# Patient Record
Sex: Male | Born: 1990 | Race: Black or African American | Hispanic: No | Marital: Single | Smoking: Current every day smoker
Health system: Southern US, Community
[De-identification: ages and names within clinical notes are randomized; demographics above are authoritative.]

## PROBLEM LIST (undated history)

## (undated) DIAGNOSIS — Q282 Arteriovenous malformation of cerebral vessels: Secondary | ICD-10-CM

## (undated) HISTORY — DX: Arteriovenous malformation of cerebral vessels: Q28.2

---

## 2015-02-13 ENCOUNTER — Encounter (HOSPITAL_COMMUNITY): Payer: Self-pay

## 2015-02-13 ENCOUNTER — Emergency Department (HOSPITAL_COMMUNITY): Payer: Self-pay

## 2015-02-13 ENCOUNTER — Emergency Department (HOSPITAL_COMMUNITY)
Admission: EM | Admit: 2015-02-13 | Discharge: 2015-02-13 | Disposition: A | Discharge status: Home / Self Care | Payer: Self-pay | Attending: Emergency Medicine | Admitting: Emergency Medicine

## 2015-02-13 DIAGNOSIS — W3400XA Accidental discharge from unspecified firearms or gun, initial encounter: Secondary | ICD-10-CM

## 2015-02-13 DIAGNOSIS — Y9389 Activity, other specified: Secondary | ICD-10-CM | POA: Insufficient documentation

## 2015-02-13 DIAGNOSIS — Y9289 Other specified places as the place of occurrence of the external cause: Secondary | ICD-10-CM | POA: Insufficient documentation

## 2015-02-13 DIAGNOSIS — Y998 Other external cause status: Secondary | ICD-10-CM | POA: Insufficient documentation

## 2015-02-13 DIAGNOSIS — T1490XA Injury, unspecified, initial encounter: Secondary | ICD-10-CM

## 2015-02-13 DIAGNOSIS — S3991XA Unspecified injury of abdomen, initial encounter: Secondary | ICD-10-CM | POA: Insufficient documentation

## 2015-02-13 DIAGNOSIS — Z720 Tobacco use: Secondary | ICD-10-CM | POA: Insufficient documentation

## 2015-02-13 LAB — CBC
HEMATOCRIT: 44.8 % (ref 39.0–52.0)
Hemoglobin: 15.1 g/dL (ref 13.0–17.0)
MCH: 31.3 pg (ref 26.0–34.0)
MCHC: 33.7 g/dL (ref 30.0–36.0)
MCV: 92.9 fL (ref 78.0–100.0)
Platelets: 213 10*3/uL (ref 150–400)
RBC: 4.82 MIL/uL (ref 4.22–5.81)
RDW: 12.8 % (ref 11.5–15.5)
WBC: 5.4 10*3/uL (ref 4.0–10.5)

## 2015-02-13 LAB — COMPREHENSIVE METABOLIC PANEL
ALT: 20 U/L (ref 17–63)
AST: 24 U/L (ref 15–41)
Albumin: 4.2 g/dL (ref 3.5–5.0)
Alkaline Phosphatase: 34 U/L — ABNORMAL LOW (ref 38–126)
Anion gap: 13 (ref 5–15)
BUN: 9 mg/dL (ref 6–20)
CHLORIDE: 103 mmol/L (ref 101–111)
CO2: 20 mmol/L — AB (ref 22–32)
Calcium: 9.2 mg/dL (ref 8.9–10.3)
Creatinine, Ser: 1.01 mg/dL (ref 0.61–1.24)
Glucose, Bld: 105 mg/dL — ABNORMAL HIGH (ref 65–99)
POTASSIUM: 3.5 mmol/L (ref 3.5–5.1)
SODIUM: 136 mmol/L (ref 135–145)
Total Bilirubin: 0.5 mg/dL (ref 0.3–1.2)
Total Protein: 6.8 g/dL (ref 6.5–8.1)

## 2015-02-13 LAB — I-STAT CHEM 8, ED
BUN: 9 mg/dL (ref 6–20)
CHLORIDE: 105 mmol/L (ref 101–111)
Calcium, Ion: 1.1 mmol/L — ABNORMAL LOW (ref 1.12–1.23)
Creatinine, Ser: 1.1 mg/dL (ref 0.61–1.24)
Glucose, Bld: 101 mg/dL — ABNORMAL HIGH (ref 65–99)
HCT: 51 % (ref 39.0–52.0)
Hemoglobin: 17.3 g/dL — ABNORMAL HIGH (ref 13.0–17.0)
POTASSIUM: 3.4 mmol/L — AB (ref 3.5–5.1)
SODIUM: 142 mmol/L (ref 135–145)
TCO2: 18 mmol/L (ref 0–100)

## 2015-02-13 LAB — PREPARE FRESH FROZEN PLASMA
Unit division: 0
Unit division: 0

## 2015-02-13 LAB — PROTIME-INR
INR: 1.07 (ref 0.00–1.49)
Prothrombin Time: 14.1 seconds (ref 11.6–15.2)

## 2015-02-13 LAB — TYPE AND SCREEN
ABO/RH(D): A POS
Antibody Screen: NEGATIVE
UNIT DIVISION: 0
UNIT DIVISION: 0

## 2015-02-13 LAB — CDS SEROLOGY

## 2015-02-13 LAB — BLOOD PRODUCT ORDER (VERBAL) VERIFICATION

## 2015-02-13 LAB — ABO/RH: ABO/RH(D): A POS

## 2015-02-13 LAB — ETHANOL: Alcohol, Ethyl (B): 47 mg/dL — ABNORMAL HIGH (ref <5)

## 2015-02-13 MED ORDER — TETANUS-DIPHTH-ACELL PERTUSSIS 5-2.5-18.5 LF-MCG/0.5 IM SUSP
0.5000 mL | Freq: Once | INTRAMUSCULAR | Status: AC
  Administered 2015-02-13: 0.5 mL via INTRAMUSCULAR
  Filled 2015-02-13: qty 0.5

## 2015-02-13 MED ORDER — SODIUM CHLORIDE 0.9 % IV SOLN
INTRAVENOUS | Status: AC | PRN
  Administered 2015-02-13: 1000 mL via INTRAVENOUS

## 2015-02-13 MED ORDER — FENTANYL CITRATE (PF) 100 MCG/2ML IJ SOLN
50.0000 ug | Freq: Once | INTRAMUSCULAR | Status: AC
  Administered 2015-02-13: 50 ug via INTRAVENOUS
  Filled 2015-02-13: qty 2

## 2015-02-13 MED ORDER — IOHEXOL 350 MG/ML SOLN: 100.0000 mL | Freq: Once | INTRAVENOUS | Status: DC | PRN

## 2015-02-13 MED ORDER — OXYCODONE-ACETAMINOPHEN 5-325 MG PO TABS: 1.0000 | ORAL_TABLET | Freq: Four times a day (QID) | ORAL | Status: AC | PRN

## 2015-02-13 NOTE — ED Notes (Signed)
Clothing removed, warm blankets given. 
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
## 2015-02-13 NOTE — ED Notes (Signed)
Pt moved to B16
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
## 2015-02-15 MED ORDER — IOHEXOL 300 MG/ML  SOLN
100.0000 mL | Freq: Once | INTRAMUSCULAR | Status: AC | PRN
  Administered 2015-02-13: 100 mL via INTRAVENOUS
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
## 2018-04-16 DIAGNOSIS — M2420 Disorder of ligament, unspecified site: Secondary | ICD-10-CM

## 2018-04-16 HISTORY — DX: Disorder of ligament, unspecified site: M24.20

## 2020-01-07 ENCOUNTER — Encounter (INDEPENDENT_AMBULATORY_CARE_PROVIDER_SITE_OTHER): Payer: Self-pay | Admitting: Neurological Surgery

## 2020-01-15 ENCOUNTER — Encounter (INDEPENDENT_AMBULATORY_CARE_PROVIDER_SITE_OTHER): Payer: Self-pay

## 2020-01-15 ENCOUNTER — Ambulatory Visit (INDEPENDENT_AMBULATORY_CARE_PROVIDER_SITE_OTHER): Payer: BC Managed Care – PPO | Admitting: Neurological Surgery

## 2020-01-15 ENCOUNTER — Encounter (INDEPENDENT_AMBULATORY_CARE_PROVIDER_SITE_OTHER): Payer: Self-pay | Admitting: Neurological Surgery

## 2020-01-15 DIAGNOSIS — Z720 Tobacco use: Secondary | ICD-10-CM

## 2020-01-15 DIAGNOSIS — Q282 Arteriovenous malformation of cerebral vessels: Secondary | ICD-10-CM | POA: Insufficient documentation

## 2020-01-15 DIAGNOSIS — G4452 New daily persistent headache (NDPH): Secondary | ICD-10-CM

## 2020-01-15 DIAGNOSIS — Z01818 Encounter for other preprocedural examination: Secondary | ICD-10-CM

## 2020-01-15 NOTE — Progress Notes (Signed)
[facility] Neurosurgery  New Patient Note    Referring MD:     Self   Primary Care MD: Lennox Pippins, MD     MRN: [id number]    HPI   Chief Complaint:  Headache, AVM     HPI  Michael Thornton is a 29 y.o. male with history of AVM who presents for new left-sided headaches for past 3 months. He was diagnosed with AVM incidentally in 10/2018 during workup for right-sided neck pain. 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 He had a cerebral angiogram with Dr. 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 He was asymptomatic at that time, and there was no treatment recommended.    At today's visit he presents with daily persistent headaches on the left side for the past 3 months. The pain is in the left temporal area and radiates into the jaw, worse with chewing. He has been taking Gabapentin and Fioricet prescribed by his PCP with little relief. He denies any other neurologic symptoms. He works at the [employer] center and has had to miss several days of work due to his pain. He is accompanied by his mother at today's visit, and they traveled from [place] for the appointment.     Impression   Mr Michael Thornton presents to the neurosurgical clinic with his mother for second opinion in regards to the management of his left-sided frontotemporal brain AVM.  The patient resides in [place] and was initially treated and managed at [facility] by Dr. Carlynn Purl.    According to the patient, the AVM has been a completely asymptomatic and incidental finding.  He underwent a diagnostic angiogram January 2020.  This demonstrates a [employer] grade 4 left-sided AVM.    Now the patient reports significant constant, progressive left-sided headaches.  I am concerned that the hemodynamic nature of the AVM is changing and the headaches are related to this.  There is no significant difference in the CTA  except for more enlarged venous drainage.    I had a lengthy discussion with the patient and his mother.  I answered all of their questions.  I reviewed the imaging studies with them in great detail.  I discussed all options.  He is awaiting neurology appointment to manage the headaches.  Options include continued surveillance.  I also discussed the possibility of follow-up diagnostic angiogram given the new symptoms and concern for symptomatic lesion to rule out any other angiographic changes or features of the AVM.  I also recommend presenting this case in our multidisciplinary conference.    I discussed the risks, benefits, alternatives and rationale and reasoning of a cerebral angiogram.  The patient and the mother understand all of this and want to proceed with the angiogram to see if there are any significant changes and to see if a treatment can be considered.    Plan   1. I discussed with the patient the nature, indications, risks and alternatives of a diagnostic cerebral angiogram.  All questions were answered to their satisfaction. The patient elects to proceed with cerebral angiogram with moderate sedation.  2. Routine labs   3.Smoking cessation advised        Again, many thanks for allowing me to participate in the care of this patient.  I will keep you apprised of their progress.  Please call me with any questions.    Sincerely,  Delena Bali MD    Neurosurgery  [phone]  (cell)  [phone]  (PA)   Alessandra Bevels, PA   ([email or web address])        Follow-up   No follow-ups on file.     Medical History   History reviewed. No pertinent past medical history.     Surgical History   History reviewed. No pertinent surgical history.     Family History   History reviewed. No pertinent family history.     Social History     Social History     Tobacco Use    Smoking status: Current Every Day Smoker    Smokeless tobacco: Never Used   Substance Use Topics    Alcohol use: Not on file      Tobacco Dependence:  Social History     Tobacco Use   Smoking Status Current Every Day Smoker   Smokeless Tobacco Never Used     Ready to quit: Not Answered  Counseling given: Not Answered      Smoking Cessation Counseling:  Date: January 15, 2020  Advice/Assessment: Patient strongly urged to quit tobacco use.   RISKS -  patient counseled on the following risks: short term risks (including dyspnea, exacerbation of asthma, impotence, infertility)  Total counseling time: 3 to 10 minutes        Current Medications       Current Outpatient Medications:     Acetaminophen (TYLENOL PO), Take by mouth, Disp: , Rfl:     gabapentin (NEURONTIN) 300 MG capsule, Take 300 mg by mouth 3 (three) times daily, Disp: , Rfl:      Allergies   No Known Allergies     Review of Systems   Constitutional: Positive for appetite change and unexpected weight change (loss).   HENT: Negative.    Eyes:        Blurry vision     Respiratory: Negative.    Cardiovascular: Negative.    Gastrointestinal: Negative.    Genitourinary: Negative.    Musculoskeletal: Negative.    Skin: Negative.    Neurological: Negative.    Hematological: Negative.    Psychiatric/Behavioral: Negative.      Physical Examination   VITAL SIGNS:  There were no vitals filed for this visit.      General:  Well developed, well nourished, no apparent distress  Neck:  Supple, no JVD, no apparent lymphadenopathy  HEENT:  Head normocephalic, atraumatic, no obvious lesions in ear, nose or throat  Chest:  Equal chest rise.  No wheezes, rales or rhonchi.  Skin:  No obvious lesions or scars  Extremities:  Without clubbing or cyanosis    Neurologic Exam    Right anterior cervical incision well-healed     Mental Status    Awake, Alert, Oriented x3  Speech clear   GCS 15     Cranial Nerves   CN III, IV, VI   Pupils are equal, round, and reactive to light.  Extraocular motions are normal.     CN V: Normal sensation in all 3 trigeminal divisions b/l     CN VII: Facial expression full,  symmetric.     CN VIII: Hearing intact b/l    CN XI: Shoulder shrug symmetric    CN XII: Tongue midline    Motor Exam   Overall muscle tone: normal     BUE/BLE strength 5/5   No pronator drift    Gait: normal    Sensory Exam  Sensation intact  to light touch throughout    Reflexes  DTRs symmetric  No ankle clonus  Negative Hoffman's  Negative Babinski      Radiology Interpretation   12/25/2019 CTA Head:    Stable Spetzler-Martin Grade 4 left frontal lobe arteriovenous malformation, with the nidus measuring 2.3 x 4.3 x 4.1 cm with major arterial feeders from left superior MCA and left ACA.     11/12/2018 Cerebral Angiogram:    Spetzler-Martin Grade 4 left frontal arteriovenous malformation    The images above were personally reviewed and discussed in detail with the patient.       Jerilynn Mages, MD    All relevant and clinical information was transcribed by me, Gus Rankin, acting as a scribe for Dr Rayann Heman.

## 2020-01-15 NOTE — Progress Notes (Signed)
Review of Systems   Constitutional: Positive for appetite change and unexpected weight change (loss).   HENT: Negative.    Eyes:        Blurry vision     Respiratory: Negative.    Cardiovascular: Negative.    Gastrointestinal: Negative.    Genitourinary: Negative.    Musculoskeletal: Negative.    Skin: Negative.    Neurological: Negative.    Hematological: Negative.    Psychiatric/Behavioral: Negative.          Taken by: Reinaldo Meeker, LPN

## 2020-02-12 ENCOUNTER — Other Ambulatory Visit (INDEPENDENT_AMBULATORY_CARE_PROVIDER_SITE_OTHER): Payer: Self-pay | Admitting: Physician Assistant

## 2020-02-12 DIAGNOSIS — Q282 Arteriovenous malformation of cerebral vessels: Secondary | ICD-10-CM

## 2020-02-25 ENCOUNTER — Encounter: Payer: Self-pay | Admitting: Neurological Surgery

## 2020-03-03 ENCOUNTER — Encounter: Payer: Self-pay | Admitting: Neurological Surgery

## 2020-03-04 ENCOUNTER — Ambulatory Visit
Admission: RE | Admit: 2020-03-04 | Discharge: 2020-03-04 | Disposition: A | Discharge status: Home / Self Care | Payer: BC Managed Care – PPO | Source: Ambulatory Visit | Attending: Neurological Surgery | Admitting: Neurological Surgery

## 2020-03-04 ENCOUNTER — Encounter
Admission: RE | Disposition: A | Discharge status: Home / Self Care | Payer: Self-pay | Source: Ambulatory Visit | Attending: Neurological Surgery

## 2020-03-04 ENCOUNTER — Encounter: Payer: Self-pay | Admitting: Neurological Surgery

## 2020-03-04 DIAGNOSIS — Q282 Arteriovenous malformation of cerebral vessels: Secondary | ICD-10-CM | POA: Insufficient documentation

## 2020-03-04 SURGERY — CEREBRAL ARTERIOGRAM
Anesthesia: Conscious Sedation | Site: Head | Laterality: Bilateral

## 2020-03-04 MED ORDER — FENTANYL CITRATE (PF) 50 MCG/ML IJ SOLN (WRAP)
INTRAMUSCULAR | Status: AC | PRN
Start: 2020-03-04 — End: 2020-03-04
  Administered 2020-03-04: 25 ug via INTRAVENOUS

## 2020-03-04 MED ORDER — HEPARIN WASH BOWL 5 UNITS/ML SOLN (CATH LAB)
Status: AC
Start: 2020-03-04 — End: ?
  Filled 2020-03-04: qty 1000

## 2020-03-04 MED ORDER — HEPARIN (PORCINE) IN NACL 2000-0.9 UNIT/L-% IV SOLN
INTRAVENOUS | Status: AC
Start: 2020-03-04 — End: ?
  Filled 2020-03-04: qty 2000

## 2020-03-04 MED ORDER — MIDAZOLAM HCL 1 MG/ML IJ SOLN (WRAP)
INTRAMUSCULAR | Status: AC | PRN
Start: 2020-03-04 — End: 2020-03-04
  Administered 2020-03-04: .5 mg via INTRAVENOUS

## 2020-03-04 MED ORDER — FENTANYL CITRATE (PF) 50 MCG/ML IJ SOLN (WRAP)
INTRAMUSCULAR | Status: AC | PRN
Start: 2020-03-04 — End: 2020-03-04
  Administered 2020-03-04: 50 ug via INTRAVENOUS

## 2020-03-04 MED ORDER — MIDAZOLAM HCL 1 MG/ML IJ SOLN (WRAP)
INTRAMUSCULAR | Status: AC
Start: 2020-03-04 — End: ?
  Filled 2020-03-04: qty 4

## 2020-03-04 MED ORDER — MIDAZOLAM HCL 1 MG/ML IJ SOLN (WRAP)
INTRAMUSCULAR | Status: AC | PRN
Start: 2020-03-04 — End: 2020-03-04
  Administered 2020-03-04: 2 mg via INTRAVENOUS

## 2020-03-04 MED ORDER — MIDAZOLAM HCL 1 MG/ML IJ SOLN (WRAP)
INTRAMUSCULAR | Status: AC | PRN
Start: 2020-03-04 — End: 2020-03-04
  Administered 2020-03-04: 1 mg via INTRAVENOUS

## 2020-03-04 MED ORDER — LIDOCAINE HCL 1 % IJ SOLN
INTRAMUSCULAR | Status: AC
Start: 2020-03-04 — End: ?
  Filled 2020-03-04: qty 20

## 2020-03-04 MED ORDER — ACETAMINOPHEN 325 MG PO TABS
650.0000 mg | ORAL_TABLET | Freq: Four times a day (QID) | ORAL | Status: DC | PRN
Start: 2020-03-04 — End: 2020-03-07

## 2020-03-04 MED ORDER — FENTANYL CITRATE (PF) 50 MCG/ML IJ SOLN (WRAP)
INTRAMUSCULAR | Status: AC
Start: 2020-03-04 — End: ?
  Filled 2020-03-04: qty 4

## 2020-03-04 MED ORDER — FENTANYL CITRATE (PF) 50 MCG/ML IJ SOLN (WRAP)
INTRAMUSCULAR | Status: AC
Start: 2020-03-04 — End: ?
  Filled 2020-03-04: qty 2

## 2020-03-04 SURGICAL SUPPLY — 28 items
CATH DX TEMPO BERN2 5F 100CM (Catheter Micellaneous) ×1
CATHETER ANGIO SLX NYL BERN2 CRV TMP 5FR (Catheter Micellaneous) ×1
CATHETER OD5 FR L100 CM RADIOPAQUE BRAID (Catheter Micellaneous) ×1
CATHETER OD5 FR L100 CM RADIOPAQUE BRAID SELECTIVE BERN2 CURVE (Catheter Miscellaneous) ×1 IMPLANT
DEVICE ANGIO-SEAL VIP CLOSURE HEMOSTATIC BIOABSORBABLE INSERTION (Clip) ×1 IMPLANT
DEVICE CLOSURE ANGIO-SEAL (Clip) ×1 IMPLANT
DEVICE CLOSURE L70 CM ANGIO-SEAL VIP (Clip) ×1 IMPLANT
DEVICE CLSR CO-PLMR CLGN ANGSL VIP 6FR (Clip) ×1 IMPLANT
DILATOR VASC STD JCD CRV 7FR 20CM STRL (Procedure Accessories) ×1
DILATOR VASCULAR OD7 FR L20 CM (Procedure Accessories) ×1
DILATOR VASCULAR OD7 FR L20 CM COOK RADIOPAQUE VESSEL STANDARD JCD (Procedure Accessories) ×1 IMPLANT
DILATOR VESSEL .038IN 7F 20CM (Procedure Accessories) ×1
GLIDEWIRE STD .038X150CM ANG (Guidwire) ×1
GUIDE WIRE, AMPLATZ 80C (Guidwire) ×1
GUIDEWIRE VASC NTNL TUNG PU HDRPH STD (Guidwire) ×1
GUIDEWIRE VASC SS PTFE 3MM RDS J CRV TPR (Guidwire) ×1
GUIDEWIRE VASC SS PTFE STRG TPR AMPTZ (Guidwire) ×1
GUIDEWIRE VASCULAR OD.035 IN L145 CM L6 (Guidwire) ×1
GUIDEWIRE VASCULAR OD.035 IN L145 CM L6 CM SAFE-T-J 3 MM RADIUS J (Guidwire) ×1 IMPLANT
GUIDEWIRE VASCULAR OD.035 IN L80 CM L7 (Guidwire) ×1
GUIDEWIRE VASCULAR OD.035 IN L80 CM L7 CM AMPLATZ STRAIGHT TAPER (Guidwire) ×1 IMPLANT
GUIDEWIRE VASCULAR OD.038 IN L150 CM L3 (Guidwire) ×1
GUIDEWIRE VASCULAR OD.038 IN L150 CM L3 CM GLIDEWIRE ANGLE STANDARD (Guidwire) ×1 IMPLANT
GUIDEWIRE.035 3X145CM FIX CORE (Guidwire) ×1
INTRODCER PINCLE 5FX10CM (Introducer) ×1
SHEATH INTRO SS HDRPH PTFE PNCL 5FR 10CM (Introducer) ×1
SHEATH INTRODUCER L10 CM L2.5 CM ID5 FR SNAP ON DILATOR LOCK KINK (Introducer) ×1 IMPLANT
SHEATH INTRODUCER L10 CM SNAP ON DILATOR (Introducer) ×1

## 2020-03-04 NOTE — Progress Notes (Signed)
Patient discharged home s/p cerebral angiogram. Patient AAOX4. VS stable. Patient denied any pain. No bleeding or hematoma noted to right groin access site. Dressing CDI. + pulses. Patient tolerated PO, ambulated, and voided without difficulty prior to discharge. IV taken out. Monitor taken off. Patient stated to understand discharge instructions including post procedure care, follow ups, medications, activity, and diet. Patient stated had no further questions. Spouse to drive patient home. Patient escorted to car via wheelchair for safe discharge.

## 2020-03-04 NOTE — Sedation Documentation (Signed)
Pt arrived from Radiology Recovery to Neuro Interventional Suite 3. Threshold pause completed. Pt transferred to table  and placed supine. Padded arm boards placed, warm blankets applied, attached to monitor and O2. VSS. Pt prepped and draped per SOP by Shawn IR VI tech.    Cerebral angiogram completed by Dr. Rayann Heman without complications using a 74F arterial sheath in right femoral artery. Once procedure completed, sheath removed and closure device deployed. Pressure held until hemostasis achieved. Sterile bandage placed over site    Total sedation given: 8mg  versed and fentanyl given.

## 2020-03-04 NOTE — Discharge Instr - AVS First Page (Signed)
Sidhartha Chandela, MD  Department of Neurosciences  Cerebrovascular and Endovascular Neurosurgery  [address] [place] [address]  [address] [place], [place] [address]  Phone: [phone] and Fax: [phone]    Post-diagnostic angiogram    RECOVERY EXPECTATIONS:  . After your procedure, you can expect to have some soreness in the groin for up to 48 -72 hours.  . You may experience some mild bruising.  . If a closure device was used, you may feel a small lump, about the size of an olive pit, which should disappear within 90 days.    GENERAL:  . Continue to take your home medications.  . Drink plenty of fluids for the next four hours; at least one 8 oz. glass every hour.    DRESSING CHANGES:  . You may remove your dressing after 24 hours  . Gently clean site with soap and water. Pat dry.  . Keep the area clean and dry until healed.  . No tub baths, hot tubs, pools or sitting in water for one week.  . You may shower 24 hours after your procedure. Leave the bandage in place the first day and let the water passively flow over the site.  . Let the water passively flow over the site, wash gently with your hand, then pat the area dry.   . Do not rub, pick or scratch the area.   . Do not apply creams, powders, lotions, or ointments to the site.   . Observe for signs of infection:  redness, warmth, swelling, drainage, or temperature greater than 100 degrees F.  If you suspect infection call the doctor who performed the procedure.    ACTIVITY:  . After your diagnostic procedure, you may return to normal activity after 72 hours. Prior to 72 hours, avoid any straining or heavy lifting.  . No driving for 24 hours following the procedure due to medications you may have received.   . Rest today and tomorrow, gradually increasing to your usual activities.  . Limit stair usage for the next 24 hours. If you must use the stairs, take them one at a time, leading with your unaffected leg  holding pressure on the bandaged site.  . Do not lift anything over 10 pounds in weight for five (5) days. That includes pushing, pulling, dragging or moving anything.  . No strenuous activity for five (5) days. Do not attempt anything that may cause fatigue, shortness of breath, perspiration or chest pain.   . Support the bandaged site when coughing or sneezing. Do not strain when having a bowel movement.   . Drink 6-8 glasses of water for at least 48 hours to help flush your body of the dye used during your procedure.     WORK:   . You may return to desk work the following day and return to job that requires heavy lifting after 72 hours.    DRIVING:  . No driving for 24 hours after your diagnostic procedure.     FOLLOW UP APPOINTMENTS:  . If you have a been told to follow-up in our neurosurgery office, schedule your appointments with Tynaea Brown at [phone] or email [email or web address].     CONTACT INFORMATION:   . Our office is open Monday-Friday 8:00am-4:30 pm.  If you contact the office after 4:30 pm you will be directed to the after-hours service, which will be an inpatient neurosurgery physician assistant.       CALL 911 if:  .   You are experiencing unrelieved chest pain.  . You notice bleeding either through the dressing or underneath the skin. If the blood is trapped under the skin, the area will hurt, become swollen and hard. If either happens, lay down flat and hold pressure on the site. This is an arterial bleed, and may become an emergency if unattended.   . Your leg becomes cold, numb, painful, pale or grayish in color, or change from usual color/sensation.

## 2020-03-04 NOTE — Progress Notes (Signed)
NEUROINTERVENTIONAL RADIOLOGY H&P    Date Time: 03/04/20 11:29 AM    PROCEDURALIST COMMENTS BELOW:     29 yo presents for angiogram    INDICATIONS:   Procedure(s):  CEREBRAL ARTERIOGRAM      PAST MEDICAL HISTORY:     Past Medical History:   Diagnosis Date    AVM (arteriovenous malformation) brain     Eagle's syndrome 2020       PAST SURGICAL HISTORY     Past Surgical History:   Procedure Laterality Date    CERVICAL SPINE SURGERY  11/2018    due eagle syndrome         REVIEW OF SYSTEMS REVIEWED:   YES  ( x )      CURRENT MEDICATION REVIEWED   YES  ( x )        ALLERGIES:     No Known Allergies      PREVIOUS REACTION TO SEDATION MEDICATIONS     NO (x )   YES ( )    PREVIOUS HIGH RADIATION DOSE CASE IN PAST 6 MONTHS?     NO (x )   YES ( )    PHYSICAL EXAM     AIRWAY CLASSIFICATION:    CLASS I   ( x )     CLASS II  (  )    CLASS III  (  )     CLASS IV  (  )    INTUBATED (  )    CARDIAC :   RRR    LUNGS:   CLEAR    ABDOMEN:   SOFT    NEURO:     intact      LABS:   No results found for: WBC, HCT, LABPLAT, INR, PT, PTT, BUN, CREAT, GLU, K          ASA PHYSICAL STATUS   (x  )  ASA 1   HEALTHY PATIENT  (  )  ASA 2   MILD SYSTEMIC ILLNESS  (  )  ASA 3   SYSTEMIC DISEASE, NOT INCAPACITATING  (  )  ASA 4   SEVERE SYSTEMIC DISEASE, DISEASE IS CONSTANT THREAT TO                         LIFE  (  )  ASA 5   MORIBUND CONDITION, NOT EXPECTED TO LIVE >24 HOURS            IRRESPECTIVE OF PROCEDURE  (  )  E           EMERGENCY PROCEDURE       PLANNED SEDATION:   (  ) NO SEDATION  ( x) MODERATE SEDATION  (  ) DEEP SEDATION WITH ANESTHESIA      CONCLUSION:   PATIENT HAS BEEN REASSESSED IMMEDIATELY PRIOR TO THE PROCEDURE   AND IS AN APPROPRIATE CANDIDATE FOR THE PLANNED SEDATION AND   PROCEDURE.  RISKS, BENEFITS AND ALTERNATIVES TO THE PLANNED   PROCEDURE HAVE BEEN EXPLAINED TO THE PATIENT OR GUARDIAN, INCLUDING THE RISKS OF RADIATION EXPOSURE AND SEDATION.    (  )  YES  (  )  EMERGENCY CONSENT     The plan for neurointerventional care  was discussed with the patient as well as the risks, benefits and alternatives. The risks include but are not limited to major bleeding, possibly requiring transfusion and/or surgery for remedy, infection, permanent weakness, paralysis, coma, stroke, need for further  surgery, failure to improve and even death. The patient understands these risks and wished to proceed with treatment as described. No guarantees were provided and I have been asked to proceed.    Signed by Jerilynn Mages, MD.  Interventional Neuroradiology  Office number: [phone]

## 2020-03-08 ENCOUNTER — Encounter (INDEPENDENT_AMBULATORY_CARE_PROVIDER_SITE_OTHER): Payer: Self-pay | Admitting: Neurological Surgery

## 2020-03-08 ENCOUNTER — Encounter: Payer: Self-pay | Admitting: Neurological Surgery

## 2020-03-09 ENCOUNTER — Encounter (INDEPENDENT_AMBULATORY_CARE_PROVIDER_SITE_OTHER): Payer: Self-pay | Admitting: Neurological Surgery

## 2020-03-11 ENCOUNTER — Encounter: Payer: Self-pay | Admitting: Neurological Surgery

## 2020-03-16 ENCOUNTER — Encounter (INDEPENDENT_AMBULATORY_CARE_PROVIDER_SITE_OTHER): Payer: Self-pay

## 2020-03-17 ENCOUNTER — Encounter (INDEPENDENT_AMBULATORY_CARE_PROVIDER_SITE_OTHER): Payer: Self-pay | Admitting: Physician Assistant

## 2020-03-17 ENCOUNTER — Telehealth: Payer: Self-pay | Admitting: Neurological Surgery

## 2020-03-17 NOTE — Telephone Encounter (Signed)
Spoke with pt who states they are doing well post angiogram. Denies headache, neuro changes, reaction to contrast.  States that groin is healed without complication of swelling, redness or pain.

## 2020-03-18 ENCOUNTER — Telehealth (INDEPENDENT_AMBULATORY_CARE_PROVIDER_SITE_OTHER): Payer: BC Managed Care – PPO | Admitting: Neurological Surgery

## 2020-03-18 ENCOUNTER — Encounter (INDEPENDENT_AMBULATORY_CARE_PROVIDER_SITE_OTHER): Payer: Self-pay | Admitting: Neurological Surgery

## 2020-03-18 DIAGNOSIS — Q282 Arteriovenous malformation of cerebral vessels: Secondary | ICD-10-CM

## 2020-03-18 DIAGNOSIS — Z720 Tobacco use: Secondary | ICD-10-CM

## 2020-03-18 NOTE — Progress Notes (Signed)
[facility] Neurosurgery  Follow Up Patient Note    Primary Care MD: Lennox Pippins, MD     MRN: [id number]      HPI   Chief Complaint:  Follow up angiogram     HPI    Michael Thornton is a 29 y.o. male with a known left frontal brain AVM that was diagnosed 3 years ago who presents for follow up on recent angiogram. 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 He underwent follow up angiogram 03/04/20 without complications. He denies any new neurologic symptoms at today's visit. He continues to complain of headaches at today's visit, now on the right and left side. He does endorse speech difficulty at times during bad headaches.     Due to the COVID-19 virus and guidelines, the patient's appointment was conducted over a video consultation (Zoom) for the safety of the patient, other patients, and office staff. Verbal consent has been obtained from Michael Thornton to conduct a video (Zoom) visit encounter to minimize exposure to COVID-19: yes    I spent 25 minutes with patient via zoom/telephone during this visit.      Impression   I had a lengthy discussion with the patient and his mother in follow-up after he underwent a diagnostic cerebral angiogram.  He is doing well from the angiogram perspective.  His angiogram was also discussed in our multidisciplinary neurovascular conference.  The angiogram reveals a left frontal large Spetzler Martin grade 4 AVM.  There are no new concerning features, there is no nidal aneurysm etc.    The consensus and recommendation is conservative and surveillance.  Treatment is a high risk of major neurologic morbidity especially in a patient who is currently neurologically intact except for headaches.    At this time our plan is to follow the patient with an MRI in 1 year or sooner if he develops more symptoms or headaches.  All questions were addressed.           Plan   1. Follow up in 6 months with MRI   2. Patient knows to follow up sooner for any new or worsening symptoms      Again, many thanks for allowing me to participate in the care of this patient.  I will keep you apprised of their progress.  Please call me with any questions.    Sincerely,    Delena Bali MD    Neurosurgery  [phone]  (cell)  [phone]  (PA)   Alessandra Bevels, PA   ([email or web address])        Follow-up   No follow-ups on file.     Medical History     Past Medical History:   Diagnosis Date    AVM (arteriovenous malformation) brain     Eagle's syndrome 2020        Surgical History     Past Surgical History:   Procedure Laterality Date    CEREBRAL ARTERIOGRAM Bilateral 03/04/2020    Procedure: CEREBRAL ARTERIOGRAM;  Surgeon: Jerilynn Mages, MD;  Location: [facility];  Service: Interventional Radiology;  Laterality: Bilateral;  [place]-------same day    CERVICAL SPINE SURGERY  11/2018    due eagle syndrome        Family History     Family History   Problem Relation Age of Onset    No known problems Sister  No known problems Brother     Cancer Maternal Grandmother     Cancer Paternal Grandmother     No known problems Sister         Social History     Social History     Tobacco Use    Smoking status: Current Every Day Smoker     Packs/day: 0.25     Years: 8.00     Pack years: 2.00     Types: Cigarettes    Smokeless tobacco: Never Used   Substance Use Topics    Alcohol use: Yes     Comment: rarely      Tobacco Dependence:  Social History     Tobacco Use   Smoking Status Current Every Day Smoker    Packs/day: 0.25    Years: 8.00    Pack years: 2.00    Types: Cigarettes   Smokeless Tobacco Never Used     Ready to quit: Not Answered  Counseling given: Not Answered      Smoking Cessation Counseling:  Date: March 18, 2020  Advice/Assessment: Patient strongly urged to quit tobacco use.   RISKS -  patient counseled on the following risks: short term risks (including  dyspnea, exacerbation of asthma, impotence, infertility)  Total counseling time: 3 to 10 minutes       Current Medications       Current Outpatient Medications:     Acetaminophen (TYLENOL PO), Take 650 mg by mouth as needed  , Disp: , Rfl:     gabapentin (NEURONTIN) 300 MG capsule, Take 300 mg by mouth 3 (three) times daily, Disp: , Rfl:      Allergies   No Known Allergies     Review of Systems   Review of Systems   Constitutional: Negative.    Respiratory: Negative.    Cardiovascular: Negative.    Gastrointestinal: Negative.    Genitourinary: Negative.    Musculoskeletal: Negative.    Neurological: Positive for speech change and headaches.     All other systems reviewed and are negative.    Physical Examination   VITAL SIGNS:  There were no vitals filed for this visit.    Due to the patient's preference and  COVID 19 CDC recommendations, the vital signs and exam are deferred at this time. Problem List, Medications, and Allergies reviewed: yes      Radiology Interpretation   Cerebral Arteriogram    Result Date: 03/04/2020  1.  Spetzler Martin grade 4 left frontal arteriovenous malformation which measures greater than 7 cm with superficial drainage and eloquent territory without evidence of nidal or flow related aneurysms and/or venous occlusion 2. There is no stenosis by NASCET criteria. END OF IMPRESSION. Dorthy Cooler Almina Schul  03/04/2020 8:56 PM     The images above were personally reviewed and discussed in detail with the patient.       Jerilynn Mages, MD    All relevant and clinical information was transcribed by me, Gus Rankin, PA, acting as a scribe for Dr Rayann Heman.

## 2020-03-21 ENCOUNTER — Encounter (INDEPENDENT_AMBULATORY_CARE_PROVIDER_SITE_OTHER): Payer: Self-pay

## 2020-03-21 NOTE — Progress Notes (Signed)
The scanned forms were e-mailed to patient and his mother to [email or web address] and [email or web address] per their request.

## 2020-03-23 ENCOUNTER — Encounter (INDEPENDENT_AMBULATORY_CARE_PROVIDER_SITE_OTHER): Payer: Self-pay

## 2020-03-28 ENCOUNTER — Encounter (INDEPENDENT_AMBULATORY_CARE_PROVIDER_SITE_OTHER): Payer: Self-pay

## 2020-08-11 ENCOUNTER — Encounter (INDEPENDENT_AMBULATORY_CARE_PROVIDER_SITE_OTHER): Payer: Self-pay | Admitting: Neurological Surgery

## 2020-08-11 NOTE — Progress Notes (Signed)
Unable to contact patient, does not have a working number. Will need to complete MRI (order in chart) and schedule follow up after.
